# Patient Record
Sex: Male | Born: 1975 | Race: White | Hispanic: No | Marital: Single | Smoking: Current every day smoker
Health system: Southern US, Community
[De-identification: ages and names within clinical notes are randomized; demographics above are authoritative.]

---

## 2006-05-26 ENCOUNTER — Emergency Department: Payer: Self-pay | Admitting: Emergency Medicine

## 2007-08-19 ENCOUNTER — Emergency Department: Payer: Self-pay | Admitting: Emergency Medicine

## 2010-11-09 ENCOUNTER — Emergency Department: Payer: Self-pay | Admitting: Internal Medicine

## 2011-07-21 ENCOUNTER — Emergency Department: Payer: Self-pay | Admitting: Emergency Medicine

## 2018-01-16 ENCOUNTER — Emergency Department
Admission: EM | Admit: 2018-01-16 | Discharge: 2018-01-16 | Disposition: A | Discharge status: Home / Self Care | Payer: Self-pay | Attending: Emergency Medicine | Admitting: Emergency Medicine

## 2018-01-16 ENCOUNTER — Encounter: Payer: Self-pay | Admitting: Emergency Medicine

## 2018-01-16 ENCOUNTER — Emergency Department: Payer: Self-pay

## 2018-01-16 DIAGNOSIS — M79671 Pain in right foot: Secondary | ICD-10-CM | POA: Insufficient documentation

## 2018-01-16 MED ORDER — TRAMADOL HCL 50 MG PO TABS: 50.0000 mg | ORAL_TABLET | Freq: Two times a day (BID) | ORAL | 0 refills | Status: DC | PRN

## 2018-01-16 MED ORDER — TRAMADOL HCL 50 MG PO TABS
50.0000 mg | ORAL_TABLET | Freq: Once | ORAL | Status: AC
  Administered 2018-01-16: 50 mg via ORAL
  Filled 2018-01-16: qty 1

## 2018-01-16 MED ORDER — IBUPROFEN 800 MG PO TABS
800.0000 mg | ORAL_TABLET | Freq: Once | ORAL | Status: AC
  Administered 2018-01-16: 800 mg via ORAL
  Filled 2018-01-16: qty 1

## 2018-01-16 MED ORDER — IBUPROFEN 800 MG PO TABS: 800.0000 mg | ORAL_TABLET | Freq: Three times a day (TID) | ORAL | 0 refills | Status: DC | PRN

## 2018-01-16 NOTE — ED Triage Notes (Signed)
Pt reports was moving a washer and dryer and slipped and fell hurting his right foot.

## 2018-01-16 NOTE — Discharge Instructions (Addendum)
Ambulate with crutches for 2 to 3 days as needed.

## 2018-01-16 NOTE — ED Provider Notes (Signed)
[facility] Emergency Department Provider Note   ____________________________________________   First MD Initiated Contact with Patient 01/16/18 1121     (approximate)  I have reviewed the triage vital signs and the nursing notes.   HISTORY  Chief Complaint Foot Injury    HPI Troy Hayden is a 42 y.o. male patient complaining of right plantar heel pain secondary to moving a washer and dryer.  Patient state he slipped while moving the machines.  Incident occurred 2 days ago.  Patient did pain increased with weightbearing.  Pain decreased with nonweightbearing.  Patient rates pain as a 10/10.  Patient described the pain is "aching".  No palliative measures for complaint.  Patient blood pressure elevated in triage.  Patient with no history of hypertension. History reviewed. No pertinent past medical history.  There are no active problems to display for this patient.   History reviewed. No pertinent surgical history.  Prior to Admission medications   Medication Sig Start Date End Date Taking? Authorizing Provider  ibuprofen (ADVIL,MOTRIN) 800 MG tablet Take 1 tablet (800 mg total) by mouth every 8 (eight) hours as needed for moderate pain. 01/16/18   Joni ReiningSmith, Vedant Shehadeh K, PA-C  traMADol (ULTRAM) 50 MG tablet Take 1 tablet (50 mg total) by mouth every 12 (twelve) hours as needed. 01/16/18   Joni ReiningSmith, Kelty Szafran K, PA-C    Allergies Patient has no allergy information on record.  No family history on file.  Social History Social History   Tobacco Use  . Smoking status: Not on file  Substance Use Topics  . Alcohol use: Not on file  . Drug use: Not on file    Review of Systems Constitutional: No fever/chills Eyes: No visual changes. ENT: No sore throat. Cardiovascular: Denies chest pain. Respiratory: Denies shortness of breath. Gastrointestinal: No abdominal pain.  No nausea, no vomiting.  No diarrhea.  No constipation. Genitourinary: Negative for  dysuria. Musculoskeletal: Right heel pain. Skin: Negative for rash. Neurological: Negative for headaches, focal weakness or numbness.   ____________________________________________   PHYSICAL EXAM:  VITAL SIGNS: ED Triage Vitals  Enc Vitals Group     BP 01/16/18 1119 (!) 162/119     Pulse Rate 01/16/18 1119 94     Resp 01/16/18 1119 18     Temp 01/16/18 1119 98.4 F (36.9 C)     Temp Source 01/16/18 1119 Oral     SpO2 01/16/18 1119 100 %     Weight 01/16/18 1118 140 lb (63.5 kg)     Height 01/16/18 1118 6\' 1"  (1.854 m)     Head Circumference --      Peak Flow --      Pain Score 01/16/18 1118 10     Pain Loc --      Pain Edu? --      Excl. in GC? --     Constitutional: Alert and oriented. Well appearing and in no acute distress. Cardiovascular: Normal rate, regular rhythm. Grossly normal heart sounds.  Good peripheral circulation.  Elevated blood pressure Respiratory: Normal respiratory effort.  No retractions. Lungs CTAB. Gastrointestinal: Soft and nontender. No distention. No abdominal bruits. No CVA tenderness. Musculoskeletal: No obvious deformity to the right heel.  No edema or erythema.  Patient is tender to palpation plantar aspect of right heel. Neurologic:  Normal speech and language. No gross focal neurologic deficits are appreciated. No gait instability. Skin:  Skin is warm, dry and intact. No rash noted. Psychiatric: Mood and affect are normal. Speech  and behavior are normal.  ____________________________________________   LABS (all labs ordered are listed, but only abnormal results are displayed)  Labs Reviewed - No data to display ____________________________________________  EKG   ____________________________________________  RADIOLOGY  ED MD interpretation:    Official radiology report(s): Dg Ankle Complete Right  Result Date: 01/16/2018 CLINICAL DATA:  42 year old male status post slip and fall while moving washer/drier. Heel and plantar  surface pain. EXAM: RIGHT ANKLE - COMPLETE 3+ VIEW COMPARISON:  None. FINDINGS: Bone mineralization is within normal limits. There is no evidence of fracture, dislocation, or joint effusion. There is no evidence of arthropathy or other focal bone abnormality. Soft tissues are unremarkable. IMPRESSION: Negative. Electronically Signed   By: Odessa Fleming M.D.   On: 01/16/2018 11:50    ____________________________________________   PROCEDURES  Procedure(s) performed: None  Procedures  Critical Care performed: No  ____________________________________________   INITIAL IMPRESSION / ASSESSMENT AND PLAN / ED COURSE  As part of my medical decision making, I reviewed the following data within the electronic MEDICAL RECORD NUMBER   Right plantar heel pain.  Discussed x-ray findings with patient.  Patient given discharge care instruction.  Patient given crutches for 2 to 3 days as needed for nonweightbearing ambulation.  Take medication as directed.  Follow-up with podiatry if no improvement in 3 days.      ____________________________________________   FINAL CLINICAL IMPRESSION(S) / ED DIAGNOSES  Final diagnoses:  Pain of right heel     ED Discharge Orders        Ordered    ibuprofen (ADVIL,MOTRIN) 800 MG tablet  Every 8 hours PRN     01/16/18 1155    traMADol (ULTRAM) 50 MG tablet  Every 12 hours PRN     01/16/18 1155       Note:  This document was prepared using Dragon voice recognition software and may include unintentional dictation errors.    Joni Reining, PA-C 01/16/18 1207    Arnaldo Natal, MD 01/16/18 [phone]

## 2018-03-13 ENCOUNTER — Other Ambulatory Visit: Payer: Self-pay

## 2018-03-13 ENCOUNTER — Emergency Department: Payer: BLUE CROSS/BLUE SHIELD

## 2018-03-13 ENCOUNTER — Emergency Department
Admission: EM | Admit: 2018-03-13 | Discharge: 2018-03-13 | Disposition: A | Discharge status: Home / Self Care | Payer: BLUE CROSS/BLUE SHIELD | Attending: Emergency Medicine | Admitting: Emergency Medicine

## 2018-03-13 ENCOUNTER — Encounter: Payer: Self-pay | Admitting: Emergency Medicine

## 2018-03-13 DIAGNOSIS — F17210 Nicotine dependence, cigarettes, uncomplicated: Secondary | ICD-10-CM | POA: Diagnosis not present

## 2018-03-13 DIAGNOSIS — R079 Chest pain, unspecified: Secondary | ICD-10-CM | POA: Insufficient documentation

## 2018-03-13 LAB — CBC
HEMATOCRIT: 47.4 % (ref 40.0–52.0)
HEMOGLOBIN: 16.6 g/dL (ref 13.0–18.0)
MCH: 33.8 pg (ref 26.0–34.0)
MCHC: 35 g/dL (ref 32.0–36.0)
MCV: 96.5 fL (ref 80.0–100.0)
Platelets: 181 10*3/uL (ref 150–440)
RBC: 4.91 MIL/uL (ref 4.40–5.90)
RDW: 13 % (ref 11.5–14.5)
WBC: 5.9 10*3/uL (ref 3.8–10.6)

## 2018-03-13 LAB — BASIC METABOLIC PANEL
ANION GAP: 10 (ref 5–15)
BUN: 6 mg/dL (ref 6–20)
CHLORIDE: 97 mmol/L — AB (ref 98–111)
CO2: 29 mmol/L (ref 22–32)
Calcium: 9.2 mg/dL (ref 8.9–10.3)
Creatinine, Ser: 0.83 mg/dL (ref 0.61–1.24)
GFR calc Af Amer: >60 mL/min (ref >60)
GFR calc non Af Amer: >60 mL/min (ref >60)
GLUCOSE: 137 mg/dL — AB (ref 70–99)
POTASSIUM: 3.9 mmol/L (ref 3.5–5.1)
Sodium: 136 mmol/L (ref 135–145)

## 2018-03-13 LAB — TROPONIN I: Troponin I: <0.03 ng/mL (ref <0.03)

## 2018-03-13 MED ORDER — GI COCKTAIL ~~LOC~~
30.0000 mL | Freq: Once | ORAL | Status: AC
  Administered 2018-03-13: 30 mL via ORAL
  Filled 2018-03-13: qty 30

## 2018-03-13 NOTE — ED Triage Notes (Signed)
Patient reports intermittent sharp left-sided chest pain x1 week. States constant discomfort. Denies SOB, nausea. Denies cardiac history.

## 2018-03-13 NOTE — ED Provider Notes (Signed)
[facility] Emergency Department Provider Note   ____________________________________________   I have reviewed the triage vital signs and the nursing notes.   HISTORY  Chief Complaint Chest Pain   History limited by: Not Limited   HPI Troy Hayden is a 42 y.o. male who presents to the emergency department today because of concerns for chest pain.  He states it is been present for about 1 week.  Is located in the left lower chest.  It is a discomfort.  Patient denies any associated shortness of breath or diaphoresis.  Patient denies any trauma.  No unusual activity prior to the pain starting.  Denies any nausea or vomiting.  No change defecation.    Per medical record review patient has a history of smoking  History reviewed. No pertinent past medical history.  There are no active problems to display for this patient.   History reviewed. No pertinent surgical history.  Prior to Admission medications   Medication Sig Start Date End Date Taking? Authorizing Provider  ibuprofen (ADVIL,MOTRIN) 800 MG tablet Take 1 tablet (800 mg total) by mouth every 8 (eight) hours as needed for moderate pain. 01/16/18   Joni ReiningSmith, Ronald K, PA-C  traMADol (ULTRAM) 50 MG tablet Take 1 tablet (50 mg total) by mouth every 12 (twelve) hours as needed. 01/16/18   Joni ReiningSmith, Ronald K, PA-C    Allergies Patient has no known allergies.  No family history on file.  Social History Social History   Tobacco Use  . Smoking status: Current Every Day Smoker    Packs/day: 0.50  . Smokeless tobacco: Never Used  Substance Use Topics  . Alcohol use: Yes  . Drug use: Never    Review of Systems Constitutional: No fever/chills Eyes: No visual changes. ENT: No sore throat. Cardiovascular: Positive for chest pain. Respiratory: Denies shortness of breath. Gastrointestinal: No abdominal pain.  No nausea, no vomiting.  No diarrhea.   Genitourinary: Negative for dysuria. Musculoskeletal:  Negative for back pain. Skin: Negative for rash. Neurological: Negative for headaches, focal weakness or numbness.  ____________________________________________   PHYSICAL EXAM:  VITAL SIGNS: ED Triage Vitals  Enc Vitals Group     BP 03/13/18 1205 (!) 170/110     Pulse Rate 03/13/18 1205 89     Resp 03/13/18 1205 16     Temp 03/13/18 1205 98.5 F (36.9 C)     Temp Source 03/13/18 1205 Oral     SpO2 03/13/18 1205 100 %     Weight 03/13/18 1201 145 lb (65.8 kg)     Height 03/13/18 1201 6\' 1"  (1.854 m)     Head Circumference --      Peak Flow --      Pain Score 03/13/18 1201 4   Constitutional: Alert and oriented.  Eyes: Conjunctivae are normal.  ENT      Head: Normocephalic and atraumatic.      Nose: No congestion/rhinnorhea.      Mouth/Throat: Mucous membranes are moist.      Neck: No stridor. Hematological/Lymphatic/Immunilogical: No cervical lymphadenopathy. Cardiovascular: Normal rate, regular rhythm.  No murmurs, rubs, or gallops.  Respiratory: Normal respiratory effort without tachypnea nor retractions. Breath sounds are clear and equal bilaterally. No wheezes/rales/rhonchi. Gastrointestinal: Soft and non tender. No rebound. No guarding.  Genitourinary: Deferred Musculoskeletal: Normal range of motion in all extremities. No lower extremity edema. Neurologic:  Normal speech and language. No gross focal neurologic deficits are appreciated.  Skin:  Skin is warm, dry and intact. No  rash noted. Psychiatric: Mood and affect are normal. Speech and behavior are normal. Patient exhibits appropriate insight and judgment.  ____________________________________________    LABS (pertinent positives/negatives)  Trop <0.03 CBC wbc 5.9, hgb 16.6, plt 181 BMP wnl except cl 97, glu 137  ____________________________________________   EKG  I, Phineas Semen, attending physician, personally viewed and interpreted this EKG  EKG Time: 1200 Rate: 78 Rhythm: normal sinus  rhythm Axis: normal Intervals: qtc 426 QRS: incomplete rbbb ST changes: no st elevation Impression: abnormal ekg  ____________________________________________    RADIOLOGY  CXR No acute findings  ____________________________________________   PROCEDURES  Procedures  ____________________________________________   INITIAL IMPRESSION / ASSESSMENT AND PLAN / ED COURSE  Pertinent labs & imaging results that were available during my care of the patient were reviewed by me and considered in my medical decision making (see chart for details).   Patient presented to the emergency department today because of concerns for chest pain.  Differential would be broad including pneumonia, pneumothorax, PE, aortic dissection, esophagitis, costochondritis amongst other etiologies.  Patient's x-ray and blood work without concerning findings.  At this point I doubt PE or dissection.  Do wonder if possible costochondritis.  Discussed findings with the patient.  Discussed return precautions.  ________________________________________   FINAL CLINICAL IMPRESSION(S) / ED DIAGNOSES  Final diagnoses:  Nonspecific chest pain     Note: This dictation was prepared with Dragon dictation. Any transcriptional errors that result from this process are unintentional     Phineas Semen, MD 03/13/18 1904

## 2018-03-13 NOTE — Discharge Instructions (Addendum)
Please seek medical attention for any high fevers, chest pain, shortness of breath, change in behavior, persistent vomiting, bloody stool or any other new or concerning symptoms.  

## 2020-03-03 IMAGING — DX DG ANKLE COMPLETE 3+V*R*
3 series · 3 of 3 positions shown · non-contrast
Comparison: None.

CLINICAL DATA: 41-year-old male status post slip and fall while
moving washer/drier. Heel and plantar surface pain.

EXAM:
RIGHT ANKLE - COMPLETE 3+ VIEW

[ankle ap]
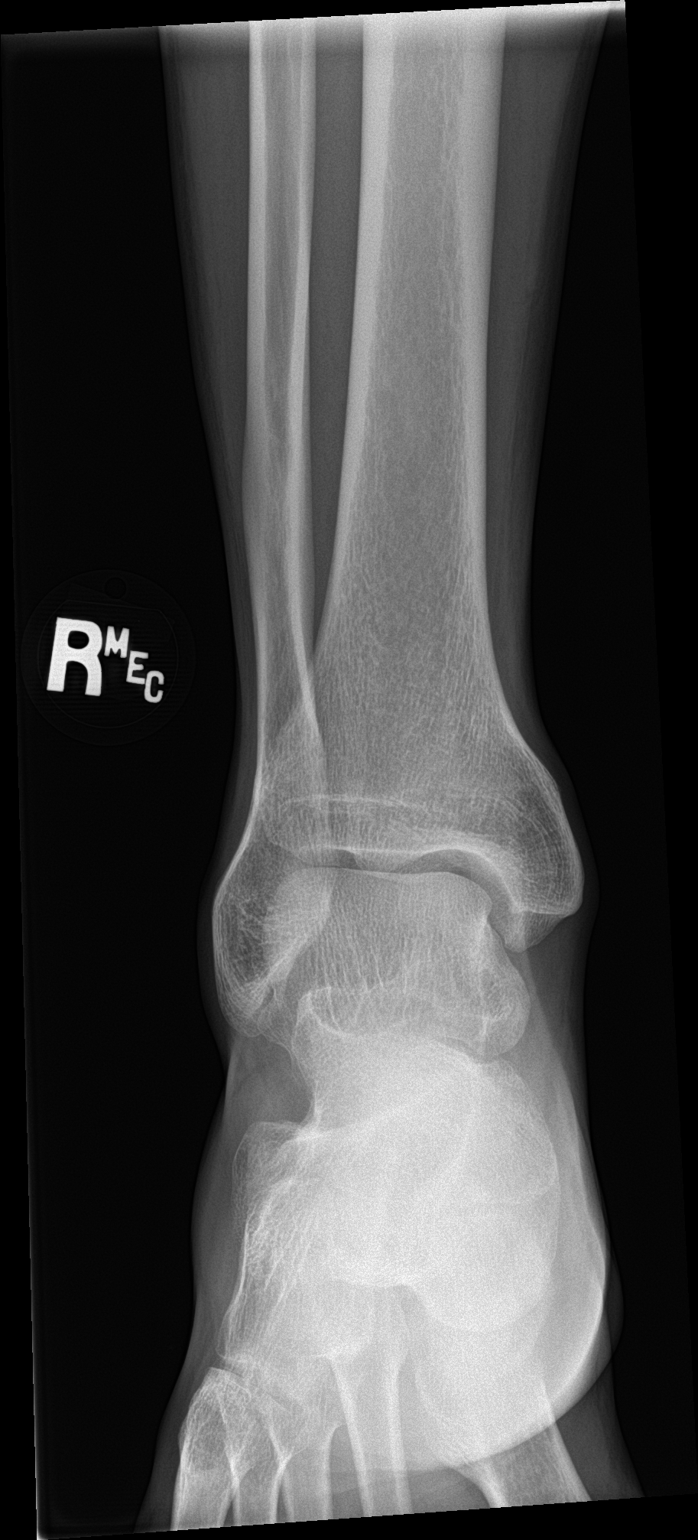

[ankle obl]
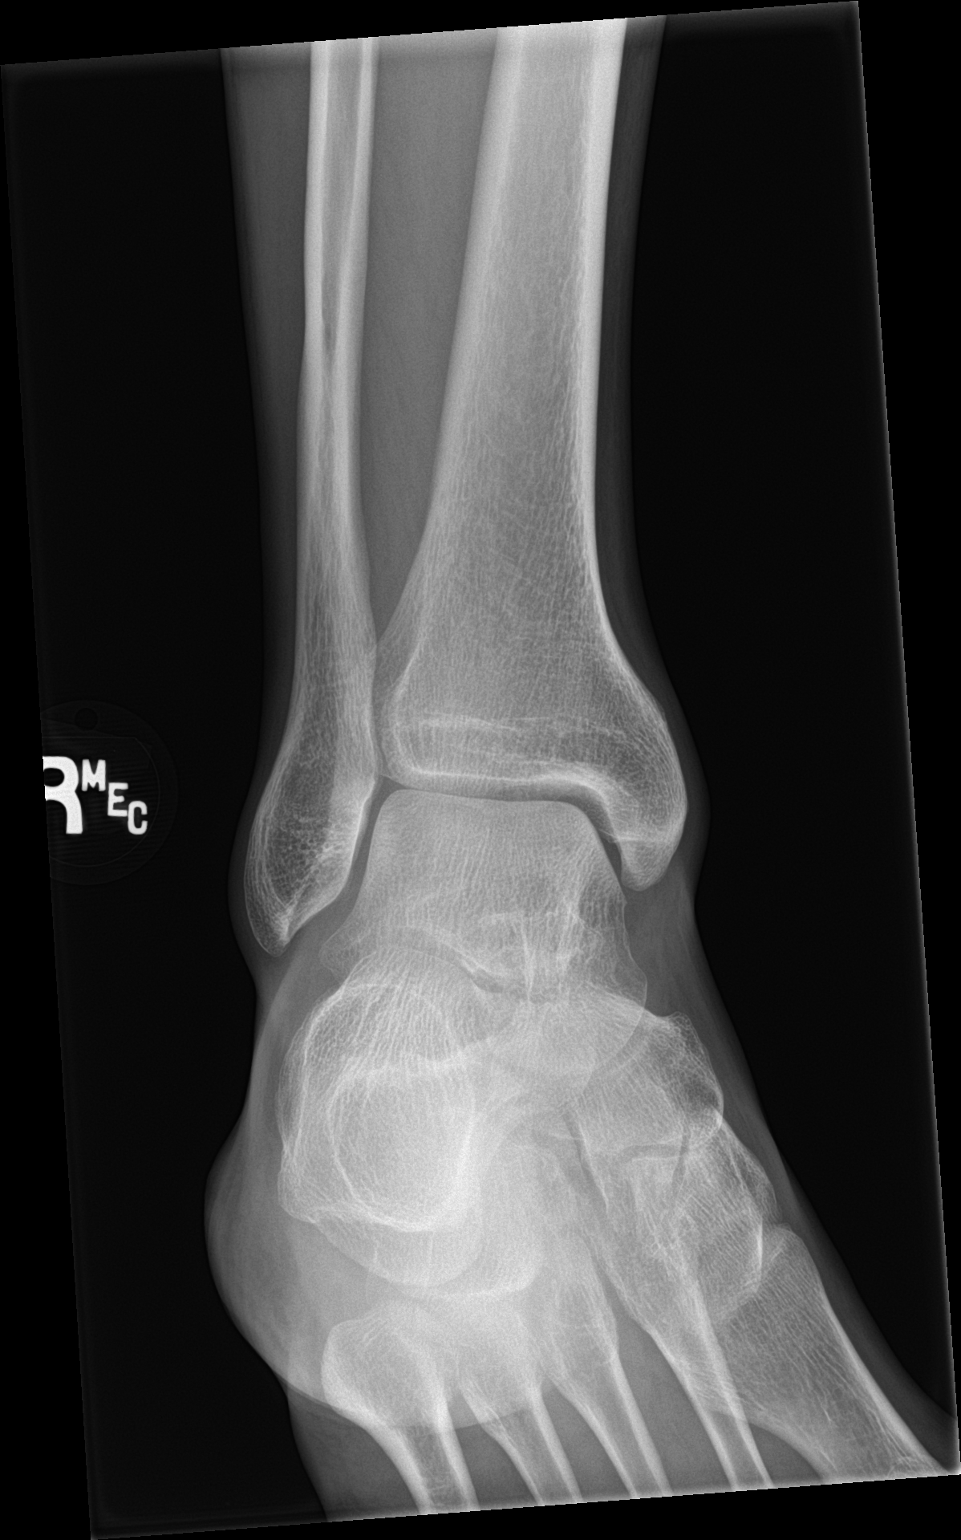

[ankle lat]
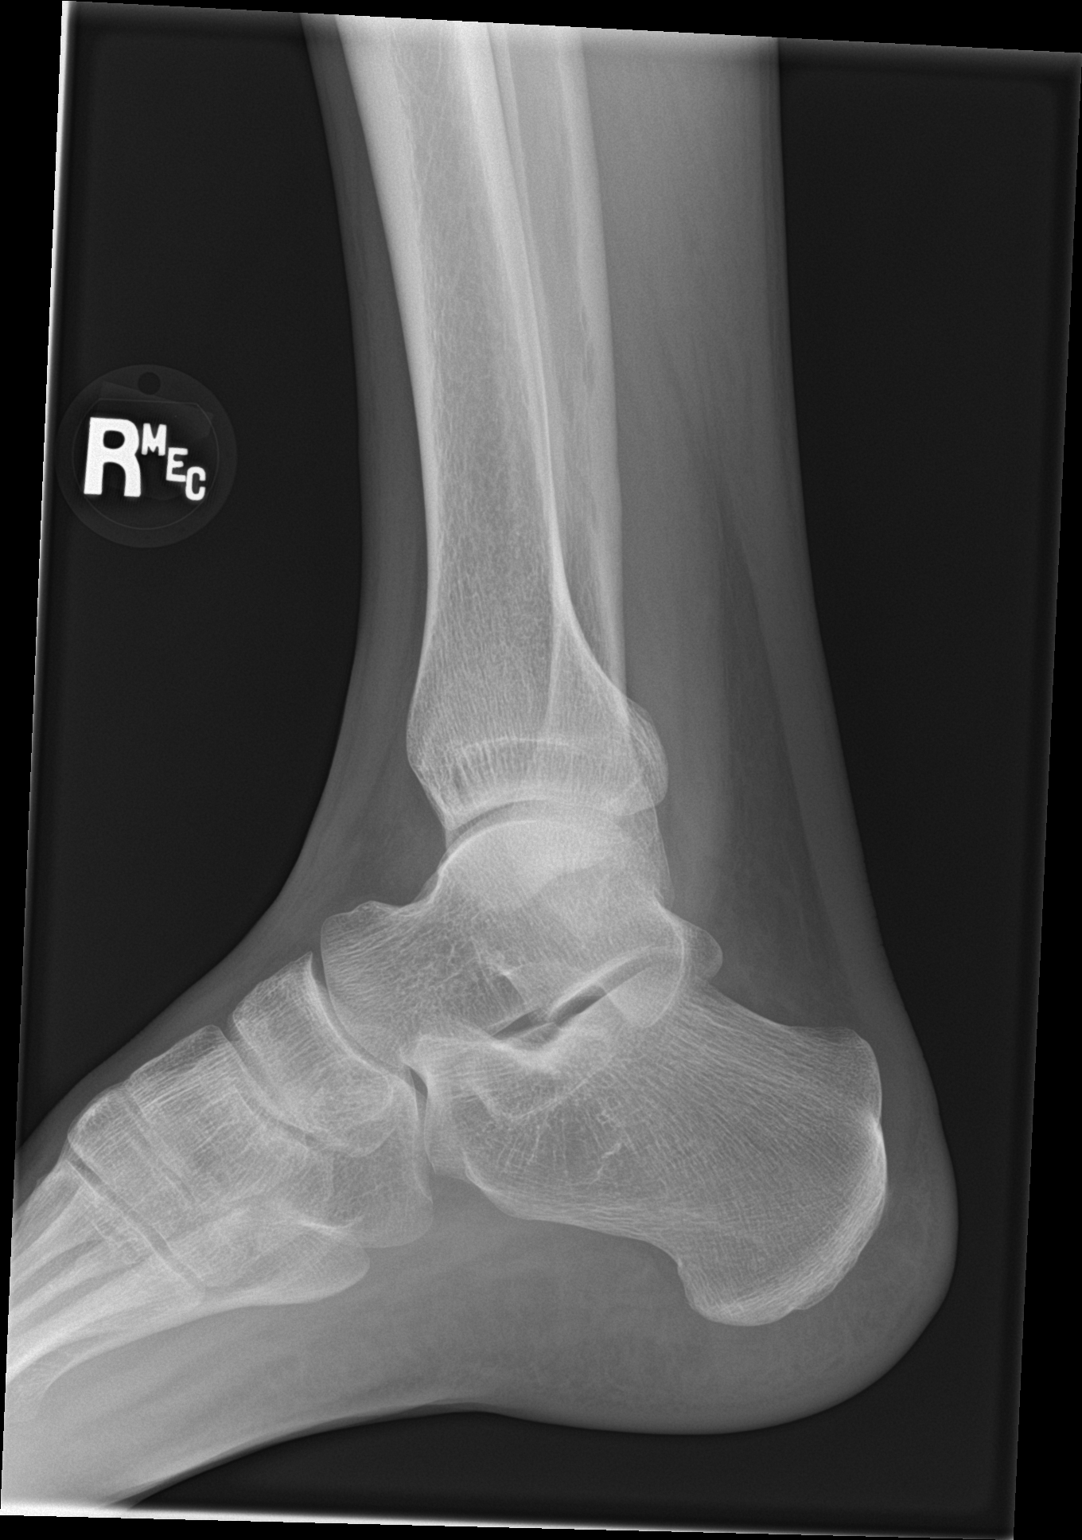

[3 of 3 positions shown; findings below may reference images not displayed]

FINDINGS: Bone mineralization is within normal limits. There is no evidence of
fracture, dislocation, or joint effusion. There is no evidence of
arthropathy or other focal bone abnormality. Soft tissues are
unremarkable.
IMPRESSION: Negative.

## 2022-02-21 ENCOUNTER — Encounter: Payer: Self-pay | Admitting: Emergency Medicine

## 2022-02-21 ENCOUNTER — Ambulatory Visit
Admission: EM | Admit: 2022-02-21 | Discharge: 2022-02-21 | Disposition: A | Discharge status: Home / Self Care | Payer: BC Managed Care – PPO

## 2022-02-21 DIAGNOSIS — H60391 Other infective otitis externa, right ear: Secondary | ICD-10-CM

## 2022-02-21 MED ORDER — CIPRO HC 0.2-1 % OT SUSP: 3.0000 [drp] | Freq: Two times a day (BID) | OTIC | 0 refills | Status: DC

## 2022-02-21 MED ORDER — NEOMYCIN-POLYMYXIN-HC 3.5-10000-1 OT SUSP: 4.0000 [drp] | Freq: Three times a day (TID) | OTIC | 0 refills | Status: DC

## 2022-02-21 NOTE — ED Triage Notes (Signed)
Patient c/o right ear pain that started on Friday.  Patient states that he did hit the right side of his right ear on his truck on Thursday afternoon.  Patient denies any other pain.

## 2022-02-21 NOTE — Discharge Instructions (Addendum)
Take the Cortisporin otic otic 3 times daily for 7 days with food for treatment of your ear infection.  Take an over-the-counter probiotic 1 hour after each dose of antibiotic to prevent diarrhea.  Use over-the-counter Tylenol and ibuprofen as needed for pain or fever.  Place a hot water bottle, or heating pad, underneath your pillowcase at night to help dilate up your ear and aid in pain relief as well as resolution of the infection.  Return for reevaluation for any new or worsening symptoms.

## 2022-02-21 NOTE — ED Provider Notes (Addendum)
[facility]    CSN: [id number] Arrival date & time: 02/21/22  1035      History   Chief Complaint Chief Complaint  Patient presents with   Otalgia    right    HPI Troy Hayden is a 46 y.o. male.   HPI  46 year old male here for evaluation of right ear pain.  Patient reports that he has been sprinting pain in his right ear with muffled hearing for the last 2 days.  He has not noticed anything draining from his ear he has not had a fever.  He denies runny nose or nasal congestion.  Left ear is unremarkable.  History reviewed. No pertinent past medical history.  There are no problems to display for this patient.   History reviewed. No pertinent surgical history.     Home Medications    Prior to Admission medications   Medication Sig Start Date End Date Taking? Authorizing Provider  ibuprofen (ADVIL,MOTRIN) 800 MG tablet Take 1 tablet (800 mg total) by mouth every 8 (eight) hours as needed for moderate pain. 01/16/18  Yes Joni Reining, PA-C  neomycin-polymyxin-hydrocortisone (CORTISPORIN) 3.5-10000-1 OTIC suspension Place 4 drops into the right ear 3 (three) times daily. 02/21/22  Yes Becky Augusta, NP  omeprazole (PRILOSEC) 20 MG capsule Take 20 mg by mouth daily.   Yes [provider]    Family History History reviewed. No pertinent family history.  Social History Social History   Tobacco Use   Smoking status: Every Day    Packs/day: 0.50    Types: Cigarettes   Smokeless tobacco: Never  Vaping Use   Vaping Use: Never used  Substance Use Topics   Alcohol use: Yes   Drug use: Never     Allergies   Patient has no known allergies.   Review of Systems Review of Systems  Constitutional:  Negative for fever.  HENT:  Positive for ear pain and hearing loss. Negative for congestion, ear discharge and rhinorrhea.      Physical Exam Triage Vital Signs ED Triage Vitals  Enc Vitals Group     BP 02/21/22 1046 (!) 176/111     Pulse  Rate 02/21/22 1046 76     Resp 02/21/22 1046 15     Temp 02/21/22 1046 98.4 F (36.9 C)     Temp Source 02/21/22 1046 Oral     SpO2 02/21/22 1046 100 %     Weight 02/21/22 1044 145 lb (65.8 kg)     Height 02/21/22 1044 6\' 1"  (1.854 m)     Head Circumference --      Peak Flow --      Pain Score 02/21/22 1044 3     Pain Loc --      Pain Edu? --      Excl. in GC? --    No data found.  Updated Vital Signs BP (!) 176/111 (BP Location: Left Arm)   Pulse 76   Temp 98.4 F (36.9 C) (Oral)   Resp 15   Ht 6\' 1"  (1.854 m)   Wt 145 lb (65.8 kg)   SpO2 100%   BMI 19.13 kg/m   Visual Acuity Right Eye Distance:   Left Eye Distance:   Bilateral Distance:    Right Eye Near:   Left Eye Near:    Bilateral Near:     Physical Exam Vitals and nursing note reviewed.  Constitutional:      Appearance: Normal appearance. He is not ill-appearing.  HENT:  Head: Normocephalic and atraumatic.     Right Ear: External ear normal. There is no impacted cerumen.     Left Ear: Tympanic membrane, ear canal and external ear normal. There is no impacted cerumen.  Skin:    General: Skin is warm and dry.     Capillary Refill: Capillary refill takes less than 2 seconds.  Neurological:     General: No focal deficit present.     Mental Status: He is alert and oriented to person, place, and time.  Psychiatric:        Mood and Affect: Mood normal.        Behavior: Behavior normal.        Thought Content: Thought content normal.        Judgment: Judgment normal.      UC Treatments / Results  Labs (all labs ordered are listed, but only abnormal results are displayed) Labs Reviewed - No data to display  EKG   Radiology No results found.  Procedures Procedures (including critical care time)  Medications Ordered in UC Medications - No data to display  Initial Impression / Assessment and Plan / UC Course  I have reviewed the triage vital signs and the nursing notes.  Pertinent labs &  imaging results that were available during my care of the patient were reviewed by me and considered in my medical decision making (see chart for details).   Patient is a very pleasant, nontoxic-appearing 46 year old male here for evaluation of 2 days with a right ear pain and and muffled hearing.  No drainage, fever, or other upper respiratory symptoms.  Patient denies any recent swimming.  Physical exam reveals a pearly-gray tympanic membrane in the left ear with a clear external auditory canal and normal light reflex.  The right external auditory canal is erythematous and edematous with some puslike drainage in the ear.  I am able visualize the tympanic membrane which is pearly gray in appearance.  Patient Damas consistent with an otitis externa.  I will treat him with Cipro otic, 3 drops twice daily for 7 days, for treatment of his otitis externa.  Patient reports that the Cipro otic is too expensive.  I will switch him to Cortisporin otic, 4 drops in his right ear 3 times a day for a week.   Final Clinical Impressions(s) / UC Diagnoses   Final diagnoses:  Other infective acute otitis externa of right ear     Discharge Instructions      Take the Cortisporin otic otic 3 times daily for 7 days with food for treatment of your ear infection.  Take an over-the-counter probiotic 1 hour after each dose of antibiotic to prevent diarrhea.  Use over-the-counter Tylenol and ibuprofen as needed for pain or fever.  Place a hot water bottle, or heating pad, underneath your pillowcase at night to help dilate up your ear and aid in pain relief as well as resolution of the infection.  Return for reevaluation for any new or worsening symptoms.      ED Prescriptions     Medication Sig Dispense Auth. Provider   ciprofloxacin-hydrocortisone (CIPRO HC) OTIC suspension  (Status: Discontinued) Place 3 drops into the right ear 2 (two) times daily. For 7 days 10 mL Becky Augusta, NP    neomycin-polymyxin-hydrocortisone (CORTISPORIN) 3.5-10000-1 OTIC suspension Place 4 drops into the right ear 3 (three) times daily. 10 mL Becky Augusta, NP      PDMP not reviewed this encounter.   Becky Augusta, NP 02/21/22  1058    Becky Augusta, NP 02/21/22 1133

## 2022-02-22 ENCOUNTER — Encounter: Payer: Self-pay | Admitting: Emergency Medicine

## 2022-02-22 ENCOUNTER — Other Ambulatory Visit: Payer: Self-pay

## 2022-02-22 ENCOUNTER — Emergency Department
Admission: EM | Admit: 2022-02-22 | Discharge: 2022-02-22 | Disposition: A | Discharge status: Home / Self Care | Payer: BC Managed Care – PPO | Attending: Emergency Medicine | Admitting: Emergency Medicine

## 2022-02-22 DIAGNOSIS — H6091 Unspecified otitis externa, right ear: Secondary | ICD-10-CM | POA: Insufficient documentation

## 2022-02-22 DIAGNOSIS — H9201 Otalgia, right ear: Secondary | ICD-10-CM | POA: Diagnosis present

## 2022-02-22 DIAGNOSIS — R509 Fever, unspecified: Secondary | ICD-10-CM | POA: Insufficient documentation

## 2022-02-22 DIAGNOSIS — Z20822 Contact with and (suspected) exposure to covid-19: Secondary | ICD-10-CM | POA: Insufficient documentation

## 2022-02-22 DIAGNOSIS — H60391 Other infective otitis externa, right ear: Secondary | ICD-10-CM

## 2022-02-22 LAB — SARS CORONAVIRUS 2 BY RT PCR: SARS Coronavirus 2 by RT PCR: NEGATIVE

## 2022-02-22 MED ORDER — CIPROFLOXACIN HCL 500 MG PO TABS
500.0000 mg | ORAL_TABLET | Freq: Once | ORAL | Status: AC
  Administered 2022-02-22: 500 mg via ORAL
  Filled 2022-02-22 (×2): qty 1

## 2022-02-22 MED ORDER — CIPROFLOXACIN HCL 500 MG PO TABS: 500.0000 mg | ORAL_TABLET | Freq: Two times a day (BID) | ORAL | 0 refills | Status: AC

## 2022-02-22 MED ORDER — CIPROFLOXACIN-DEXAMETHASONE 0.3-0.1 % OT SUSP: 4.0000 [drp] | Freq: Two times a day (BID) | OTIC | 0 refills | Status: AC

## 2022-02-22 NOTE — ED Provider Notes (Signed)
[facility] Emergency Department Provider Note     None    (approximate)   History   Ear Pain   HPI  Troy Hayden is a 46 y.o. male presents to the ED for evaluation of persistent, progressive right ear pain and fevers. He was evaluated by a local UC yesterday, and discharged with Polytrim after the CiproHC was too expensive at $400. He presents today with decreased hearing, fevers, and right ear swelling.    Physical Exam   Triage Vital Signs: ED Triage Vitals  Enc Vitals Group     BP 02/22/22 1910 (!) 180/96     Pulse Rate 02/22/22 1910 89     Resp 02/22/22 1910 16     Temp 02/22/22 1910 99 F (37.2 C)     Temp Source 02/22/22 1910 Oral     SpO2 02/22/22 1910 100 %     Weight 02/22/22 1706 144 lb 13.5 oz (65.7 kg)     Height 02/22/22 1706 6\' 1"  (1.854 m)     Head Circumference --      Peak Flow --      Pain Score 02/22/22 1706 8     Pain Loc --      Pain Edu? --      Excl. in GC? --     Most recent vital signs: Vitals:   02/22/22 1910  BP: (!) 180/96  Pulse: 89  Resp: 16  Temp: 99 F (37.2 C)  SpO2: 100%    General Awake, no distress. NAD HEENT NCAT. PERRL. EOMI. No rhinorrhea. Mucous membranes are moist. Right ear with significant edema and erythema.  The ear canal is edematous without ability to visualize the TM.  No maceration otorrhea is noted. CV:  Good peripheral perfusion.  RESP:  Normal effort.  ABD:  No distention.    ED Results / Procedures / Treatments   Labs (all labs ordered are listed, but only abnormal results are displayed) Labs Reviewed  SARS CORONAVIRUS 2 BY RT PCR     EKG    RADIOLOGY   No results found.   PROCEDURES:  Critical Care performed: No  Procedures   MEDICATIONS ORDERED IN ED: Medications  ciprofloxacin (CIPRO) tablet 500 mg (500 mg Oral Given 02/22/22 1939)     IMPRESSION / MDM / ASSESSMENT AND PLAN / ED COURSE  I reviewed the triage vital signs and the nursing  notes.                              Differential diagnosis includes, but is not limited to, otitis externa, AOM, facial cellulitis, mastoiditis   Patient's presentation is most consistent with acute, uncomplicated illness.  Patient's diagnosis is consistent with acute severe otitis externa. Patient will be discharged home with prescriptions for Cipro HC drops and Cipro. Patient is to follow up with [facility] as needed or otherwise directed. Patient is given ED precautions to return to the ED for any worsening or new symptoms.     FINAL CLINICAL IMPRESSION(S) / ED DIAGNOSES   Final diagnoses:  Other infective acute otitis externa of right ear     Rx / DC Orders   ED Discharge Orders          Ordered    ciprofloxacin-dexamethasone (CIPRODEX) OTIC suspension  2 times daily        02/22/22 1929    ciprofloxacin (CIPRO) 500 MG tablet  2 times daily        02/22/22 1929             Note:  This document was prepared using Dragon voice recognition software and may include unintentional dictation errors.    Lissa Hoard, PA-C 02/22/22 2002    Arnaldo Natal, MD 02/22/22 [phone]

## 2022-02-22 NOTE — ED Notes (Addendum)
E-signature pad unavailable - Pt verbalized understanding of D/C information - no additional concerns at this time.  D/C by provider.

## 2022-02-22 NOTE — Discharge Instructions (Addendum)
Take antibiotic as directed and the eyedrops as prescribed.  Follow-up with your primary provider or this ED if symptoms persist.  Follow-up with [facility] for ongoing symptoms.

## 2022-02-22 NOTE — ED Triage Notes (Signed)
C?O  right ear pain since Friday.  Seen through urgent care yesterday, diagnosed with otitis externa, started on ear drops.  Presents today with wife c/o worsening swelling and pain to ear.  Also fever.  AAOx3.  Skin warm and dry. NAD

## 2023-04-21 ENCOUNTER — Ambulatory Visit: Payer: BC Managed Care – PPO

## 2023-04-21 ENCOUNTER — Ambulatory Visit
Admission: EM | Admit: 2023-04-21 | Discharge: 2023-04-21 | Disposition: A | Discharge status: Home / Self Care | Payer: BC Managed Care – PPO | Attending: Family Medicine | Admitting: Family Medicine

## 2023-04-21 DIAGNOSIS — M25561 Pain in right knee: Secondary | ICD-10-CM

## 2023-04-21 DIAGNOSIS — S80251A Superficial foreign body, right knee, initial encounter: Secondary | ICD-10-CM | POA: Diagnosis not present

## 2023-04-21 MED ORDER — CEPHALEXIN 500 MG PO CAPS: 500.0000 mg | ORAL_CAPSULE | Freq: Three times a day (TID) | ORAL | 0 refills | Status: DC

## 2023-04-21 MED ORDER — NAPROXEN 500 MG PO TABS: 500.0000 mg | ORAL_TABLET | Freq: Two times a day (BID) | ORAL | 0 refills | Status: AC

## 2023-04-21 NOTE — Discharge Instructions (Addendum)
Stop by the pharmacy to pick up your prescriptions.  Call an [facility] office and schedule an appointment with an orthopedic surgeon to get this foreign body removed.

## 2023-04-21 NOTE — ED Triage Notes (Signed)
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 Pt states that he removed 4 metal slivers from his knee and fears that there are more in his knee.   Pt wife states that he now has a cyst along the base of his knee.   Pt states that it is tender to touch.

## 2023-04-21 NOTE — ED Provider Notes (Signed)
[facility]    CSN: [id number] Arrival date & time: 04/21/23  1402      History   Chief Complaint Chief Complaint  Patient presents with   Foreign Body in Skin    HPI  HPI Troy Hayden is a 47 y.o. male.   Troy Hayden presents for right knee pain after taking metal tire wire chords that occurred a "several months ago".  Pulled out at least 4 fine wires from the knee.  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  Has been experiencing pain in right knee whereas before it was not hurting.  He has noticed a little knot there as well.  Nothing taken for pain prior to arrival.     History reviewed. No pertinent past medical history.  There are no problems to display for this patient.   History reviewed. No pertinent surgical history.     Home Medications    Prior to Admission medications   Medication Sig Start Date End Date Taking? Authorizing Provider  cephALEXin (KEFLEX) 500 MG capsule Take 1 capsule (500 mg total) by mouth 3 (three) times daily. 04/21/23  Yes Latana Colin, DO  naproxen (NAPROSYN) 500 MG tablet Take 1 tablet (500 mg total) by mouth 2 (two) times daily with a meal. 04/21/23  Yes Dewey Viens, DO  omeprazole (PRILOSEC) 20 MG capsule Take 20 mg by mouth daily.   Yes [provider]    Family History History reviewed. No pertinent family history.  Social History Social History   Tobacco Use   Smoking status: Every Day    Current packs/day: 0.50    Types: Cigarettes   Smokeless tobacco: Never  Vaping Use   Vaping status: Never Used  Substance Use Topics   Alcohol use: Yes   Drug use: Never     Allergies   Patient has no known allergies.   Review of Systems Review of Systems: :negative unless otherwise stated in HPI.      Physical Exam Triage Vital Signs ED Triage Vitals  Encounter Vitals Group     BP 04/21/23 1452 (!) 140/111     Systolic BP Percentile --      Diastolic BP  Percentile --      Pulse Rate 04/21/23 1452 75     Resp --      Temp 04/21/23 1452 98.4 F (36.9 C)     Temp Source 04/21/23 1452 Oral     SpO2 04/21/23 1452 100 %     Weight 04/21/23 1451 140 lb (63.5 kg)     Height 04/21/23 1451 6' (1.829 m)     Head Circumference --      Peak Flow --      Pain Score 04/21/23 1450 9     Pain Loc --      Pain Education --      Exclude from Growth Chart --    No data found.  Updated Vital Signs BP (!) 140/111 (BP Location: Left Arm)   Pulse 75   Temp 98.4 F (36.9 C) (Oral)   Ht 6' (1.829 m)   Wt 63.5 kg   SpO2 100%   BMI 18.99 kg/m   Visual Acuity Right Eye Distance:   Left Eye Distance:   Bilateral Distance:    Right Eye Near:   Left Eye Near:    Bilateral Near:     Physical Exam GEN: well appearing male in no acute distress  CVS: well perfused  RESP: speaking in full sentences without pause, no respiratory distress  MSK: Right Knee Exam -Inspection: no deformity, no discoloration, 1.5 cm flesh toned growth that is TTP  -Palpation: no joint line tenderness -ROM: Extension: 0 degrees; Flexion: 140 degrees.   -Special Tests: Deferred  -Limb neurovascularly intact, no instability noted    UC Treatments / Results  Labs (all labs ordered are listed, but only abnormal results are displayed) Labs Reviewed - No data to display  EKG   Radiology DG Knee Complete 4 Views Right  Result Date: 04/21/2023 CLINICAL DATA:  Foreign object. Removed 4 middle slivers from knee. Knee tenderness. EXAM: RIGHT KNEE - COMPLETE 4+ VIEW COMPARISON:  None Available. FINDINGS: No evidence of fracture, dislocation, or joint effusion. No evidence of arthropathy or other focal bone abnormality. There are 2 small linear foreign bodies in the infrapatellar soft tissues medially, 5 and 3 mm. IMPRESSION: Two small linear foreign bodies in the infrapatellar soft tissues medially, 5 and 3 mm. Electronically Signed   By: Narda Rutherford M.D.   On:  04/21/2023 17:09     Procedures Procedures (including critical care time)  Medications Ordered in UC Medications - No data to display  Initial Impression / Assessment and Plan / UC Course  I have reviewed the triage vital signs and the nursing notes.  Pertinent labs & imaging results that were available during my care of the patient were reviewed by me and considered in my medical decision making (see chart for details).      Pt is a 47 y.o.  male with several months of suspected foreign body with new onset right knee pain. On exam, pt has tenderness at posteriolateral knee with flesh toned growth that is TTP. Obtained right knee plain films.  Personally interpreted by me were remarkable for 2 small foreign bodies which appear to be centralized but negative for fracture or dislocation. Patient aware the radiologist has not read his xray and is comfortable with the preliminary read by me. Will review radiologist read when available and call patient if a change in plan is warranted. .  Pt agreeable to this plan prior to discharge.   Start antibiotics for foreign body as below. Patient to gradually return to normal activities, as tolerated and continue ordinary activities within the limits permitted by pain. Prescribed Naproxen sodium  for pain relief.  Tylenol PRN. Advised patient to avoid OTC NSAIDs while taking prescription NSAID. Counseled patient on red flag symptoms and when to seek immediate care.    Patient to follow up with orthopedic surgeon for foreign body removal.  Return and ED precautions given. Understanding voiced. Discussed MDM, treatment plan and plan for follow-up with patient and his wife who agree with plan.   Radiologist impression reviewed.   Final Clinical Impressions(s) / UC Diagnoses   Final diagnoses:  Acute pain of right knee  Foreign body of knee, right, initial encounter     Discharge Instructions      Stop by the pharmacy to pick up your prescriptions.   Call an [facility] office and schedule an appointment with an orthopedic surgeon to get this foreign body removed.       ED Prescriptions     Medication Sig Dispense Auth. Provider   naproxen (NAPROSYN) 500 MG tablet Take 1 tablet (500 mg total) by mouth 2 (two) times daily with a meal. 30 tablet Vicci Reder, DO   cephALEXin (KEFLEX) 500 MG capsule Take 1 capsule (500 mg  total) by mouth 3 (three) times daily. 21 capsule Katha Cabal, DO      PDMP not reviewed this encounter.   Katha Cabal, DO 04/24/23 1008

## 2024-02-13 ENCOUNTER — Ambulatory Visit
Admission: EM | Admit: 2024-02-13 | Discharge: 2024-02-13 | Disposition: A | Discharge status: Home / Self Care | Attending: Family Medicine | Admitting: Family Medicine

## 2024-02-13 DIAGNOSIS — H60501 Unspecified acute noninfective otitis externa, right ear: Secondary | ICD-10-CM | POA: Diagnosis not present

## 2024-02-13 MED ORDER — CIPROFLOXACIN-DEXAMETHASONE 0.3-0.1 % OT SUSP: 4.0000 [drp] | Freq: Two times a day (BID) | OTIC | 0 refills | Status: AC

## 2024-02-13 NOTE — ED Provider Notes (Signed)
 [facility]    CSN: [id number] Arrival date & time: 02/13/24  0817      History   Chief Complaint Chief Complaint  Patient presents with   Otalgia    HPI Troy Hayden is a 48 y.o. male.   HPI   Troy Hayden presents for right ear pain that yesterday but has been itching for a couple weeks.  He wears ear pods at work. He tried peroxide without relief.   No recent swimming but maybe got rain water in his ear at work.  No cough, fever, rhinorrhea, nasal congestion and headache.       History reviewed. No pertinent past medical history.  There are no active problems to display for this patient.   History reviewed. No pertinent surgical history.     Home Medications    Prior to Admission medications   Medication Sig Start Date End Date Taking? Authorizing Provider  ciprofloxacin -dexamethasone  (CIPRODEX ) OTIC suspension Place 4 drops into the right ear 2 (two) times daily for 7 days. 02/13/24 02/20/24 Yes Beonka Amesquita, DO  naproxen  (NAPROSYN ) 500 MG tablet Take 1 tablet (500 mg total) by mouth 2 (two) times daily with a meal. 04/21/23   Manali Mcelmurry, DO  omeprazole (PRILOSEC) 20 MG capsule Take 20 mg by mouth daily.    [provider]    Family History History reviewed. No pertinent family history.  Social History Social History   Tobacco Use   Smoking status: Every Day    Current packs/day: 0.50    Types: Cigarettes   Smokeless tobacco: Never  Vaping Use   Vaping status: Never Used  Substance Use Topics   Alcohol use: Yes   Drug use: Never     Allergies   Patient has no known allergies.   Review of Systems Review of Systems: :negative unless otherwise stated in HPI.      Physical Exam Triage Vital Signs ED Triage Vitals  Encounter Vitals Group     BP 02/13/24 0823 (!) 160/88     Girls Systolic BP Percentile --      Girls Diastolic BP Percentile --      Boys Systolic BP Percentile --      Boys Diastolic BP Percentile --       Pulse Rate 02/13/24 0823 82     Resp 02/13/24 0823 16     Temp 02/13/24 0823 98.4 F (36.9 C)     Temp Source 02/13/24 0823 Oral     SpO2 02/13/24 0823 99 %     Weight 02/13/24 0822 144 lb 3.2 oz (65.4 kg)     Height 02/13/24 0822 6' 1 (1.854 m)     Head Circumference --      Peak Flow --      Pain Score 02/13/24 0827 7     Pain Loc --      Pain Education --      Exclude from Growth Chart --    No data found.  Updated Vital Signs BP (!) 160/88 (BP Location: Left Arm)   Pulse 82   Temp 98.4 F (36.9 C) (Oral)   Resp 16   Ht 6' 1 (1.854 m)   Wt 65.4 kg   SpO2 99%   BMI 19.02 kg/m   Visual Acuity Right Eye Distance:   Left Eye Distance:   Bilateral Distance:    Right Eye Near:   Left Eye Near:    Bilateral Near:     Physical Exam GEN:  alert, non-toxic appearing male in no distress    HENT:  mucus membranes moist, no nasal discharge, left TM normal, right TM not visible due to external auditory canals edematous with white discharge, nontender tragus, pain with insertion of ear curette  EYES:   no scleral injection or discharge  NECK:  normal ROM  RESP:  no increased work of breathing CVS:   regular rate  Skin:   warm and dry    UC Treatments / Results  Labs (all labs ordered are listed, but only abnormal results are displayed) Labs Reviewed - No data to display  EKG   Radiology No results found.  Procedures Procedures (including critical care time)  Medications Ordered in UC Medications - No data to display  Initial Impression / Assessment and Plan / UC Course  I have reviewed the triage vital signs and the nursing notes.  Pertinent labs & imaging results that were available during my care of the patient were reviewed by me and considered in my medical decision making (see chart for details).     Acute Otitis externa  Overall patient is well-appearing, well-hydrated and without respiratory distress. [place] is afebrile. Treat with Ciprodex   for 7 days.  Tylenol/Motrin 's as needed for fever or discomfort.  Recommended regular cleaning of his ipods/headphones. Stressed importance of hydration.  School  note provided, per request. Discussed return and ED precautions, understanding voiced.  If symptoms persist recommended ENT follow-up.     Discussed MDM, treatment plan and plan for follow-up with patient who agrees with plan.   Final Clinical Impressions(s) / UC Diagnoses   Final diagnoses:  Acute otitis externa of right ear, unspecified type     Discharge Instructions      Stop by the pharmacy to pick up your prescriptions.  Follow up with your primary care provider or return to the urgent care, if not improving.       ED Prescriptions     Medication Sig Dispense Auth. Provider   ciprofloxacin -dexamethasone  (CIPRODEX ) OTIC suspension Place 4 drops into the right ear 2 (two) times daily for 7 days. 7.5 mL Candace Ramus, DO      PDMP not reviewed this encounter.   Hardie Veltre, DO 02/13/24 0840

## 2024-02-13 NOTE — ED Triage Notes (Signed)
 Pt c/o R ear pain x1 day. Has tried OTC ear drops w/o relief.

## 2024-02-13 NOTE — Discharge Instructions (Signed)
 Stop by the pharmacy to pick up your prescriptions.  Follow up with your primary care provider or return to the urgent care, if not improving.
